# Patient Record
Sex: Female | Born: 1945 | Race: Black or African American | Hispanic: No
Health system: Southern US, Community
[De-identification: ages and names within clinical notes are randomized; demographics above are authoritative.]

---

## 2019-07-17 ENCOUNTER — Other Ambulatory Visit: Payer: Self-pay

## 2019-07-17 ENCOUNTER — Emergency Department (HOSPITAL_COMMUNITY): Payer: Medicare Other

## 2019-07-17 ENCOUNTER — Emergency Department (HOSPITAL_COMMUNITY)
Admission: EM | Admit: 2019-07-17 | Discharge: 2019-07-18 | Disposition: A | Discharge status: Home / Self Care | Payer: Medicare Other | Attending: Emergency Medicine | Admitting: Emergency Medicine

## 2019-07-17 ENCOUNTER — Encounter (HOSPITAL_COMMUNITY): Payer: Self-pay | Admitting: Emergency Medicine

## 2019-07-17 DIAGNOSIS — R531 Weakness: Secondary | ICD-10-CM | POA: Diagnosis not present

## 2019-07-17 DIAGNOSIS — R05 Cough: Secondary | ICD-10-CM | POA: Diagnosis not present

## 2019-07-17 DIAGNOSIS — Z794 Long term (current) use of insulin: Secondary | ICD-10-CM | POA: Insufficient documentation

## 2019-07-17 DIAGNOSIS — E11649 Type 2 diabetes mellitus with hypoglycemia without coma: Secondary | ICD-10-CM | POA: Diagnosis not present

## 2019-07-17 DIAGNOSIS — U071 COVID-19: Secondary | ICD-10-CM | POA: Insufficient documentation

## 2019-07-17 DIAGNOSIS — E162 Hypoglycemia, unspecified: Secondary | ICD-10-CM

## 2019-07-17 LAB — CBG MONITORING, ED: Glucose-Capillary: 99 mg/dL (ref 70–99)

## 2019-07-17 NOTE — ED Triage Notes (Addendum)
Pt arrived via [facility] from [facility]. Per Facility pt was having respiratory issues with o2 sats in 80s. On arrival CBG 38, 1 mg glucogon administered by facility. Follow up CBG 81. Pt maintained 94 SPo2 enroute on room air and c/o dry cough. Pt found in covid wing and is on week 2 of Covid diagnosis

## 2019-07-17 NOTE — ED Provider Notes (Signed)
[facility] EMERGENCY DEPARTMENT Provider Note   CSN: [id number] Arrival date & time: 07/17/19  2210     History   Chief Complaint Chief Complaint  Patient presents with  . covid +  . Hypoglycemia    HPI Vallory Oetken is a 73 y.o. female.     The history is provided by the patient and the nursing home.  Hypoglycemia Severity:  Severe Onset quality:  Sudden Timing:  Constant Progression:  Improving Chronicity:  New Diabetic status:  Controlled with insulin Relieved by:  Glucagon Associated symptoms: weakness   Associated symptoms: no shortness of breath and no vomiting   Patient presents from nursing facility.  Per facility, patient was having respiratory issues and coughing.  She was also noted to be more lethargic and weak glucose was in the 30s.  Glucagon was given and patient improved.  Patient is currently residing on the Covid unit at the facility.  She has been there for about 4 days  Currently patient reports cough but denies any other complaints.  She denies any chest pain or shortness of breath.  Per nursing facility, last dose of insulin was 11:30 AM   PMH-diabetes OB History   No obstetric history on file.      Home Medications    Prior to Admission medications   Not on File    Family History No family history on file.  Social History Social History   Tobacco Use  . Smoking status: Not on file  Substance Use Topics  . Alcohol use: Not on file  . Drug use: Not on file     Allergies   Patient has no allergy information on record.   Review of Systems Review of Systems  Respiratory: Positive for cough. Negative for shortness of breath.   Cardiovascular: Negative for chest pain.  Gastrointestinal: Negative for vomiting.  Neurological: Positive for weakness.  All other systems reviewed and are negative.    Physical Exam Updated Vital Signs BP 104/65   Pulse 63   Temp 98.4 F (36.9 C) (Oral)   Resp 18    SpO2 98%   Physical Exam CONSTITUTIONAL: Elderly, no acute distress HEAD: Normocephalic/atraumatic EYES: EOMI/PERRL ENMT: Mucous membranes moist NECK: supple no meningeal signs SPINE/BACK:entire spine nontender CV: S1/S2 noted, no murmurs/rubs/gallops noted LUNGS: Coarse breath sounds in the bases, no apparent distress ABDOMEN: soft, nontender, no rebound or guarding, bowel sounds noted throughout abdomen GU:no cva tenderness NEURO: Pt is awake/alert/appropriate, moves all extremitiesx4.  No facial droop.   EXTREMITIES: pulses normal/equal, full ROM SKIN: warm, color normal PSYCH: no abnormalities of mood noted, alert and oriented to situation   ED Treatments / Results  Labs (all labs ordered are listed, but only abnormal results are displayed) Labs Reviewed  BASIC METABOLIC PANEL - Abnormal; Notable for the following components:      Result Value   Glucose, Bld 105 (*)    Calcium 8.7 (*)    All other components within normal limits  CBC WITH DIFFERENTIAL/PLATELET - Abnormal; Notable for the following components:   WBC 16.7 (*)    Hemoglobin 10.8 (*)    HCT 35.0 (*)    MCH 25.2 (*)    Neutro Abs 12.8 (*)    Monocytes Absolute 1.4 (*)    Abs Immature Granulocytes 0.23 (*)    All other components within normal limits  BRAIN NATRIURETIC PEPTIDE - Abnormal; Notable for the following components:   B Natriuretic Peptide 132.0 (*)    All  other components within normal limits  CBG MONITORING, ED - Abnormal; Notable for the following components:   Glucose-Capillary 61 (*)    All other components within normal limits  CBG MONITORING, ED - Abnormal; Notable for the following components:   Glucose-Capillary 124 (*)    All other components within normal limits  CBG MONITORING, ED  CBG MONITORING, ED  CBG MONITORING, ED  CBG MONITORING, ED    EKG EKG Interpretation  Date/Time:  Friday July 18 2019 00:25:10 EST Ventricular Rate:  65 PR Interval:    QRS Duration: 90  QT Interval:  426 QTC Calculation: 443 R Axis:   -25 Text Interpretation: Sinus rhythm RSR' in V1 or V2, probably normal variant Inferior infarct, age indeterminate Lateral leads are also involved Interpretation limited secondary to artifact No previous ECGs available Confirmed by Zadie Rhine ([id number]) on 07/18/2019 12:38:57 AM   Radiology Dg Chest Port 1 View  Result Date: 07/17/2019 CLINICAL DATA:  Cough, low O2 sats EXAM: PORTABLE CHEST 1 VIEW COMPARISON:  None. FINDINGS: There are some coarse interstitial opacities and patchy areas of airspace disease in the lower lungs bilaterally. Lung volumes are diminished with atelectatic change potentially accentuating the appearance. The aorta is calcified. The remaining cardiomediastinal contours are unremarkable. Degenerative changes are present in the imaged spine and shoulders. Vascular calcium noted in the base of the right neck. No acute osseous or soft tissue abnormality. IMPRESSION: 1. Interstitial and airspace opacities in the bases may reflect developing edema versus atypical infection. 2. Low lung volumes with atelectatic change. 3. Aortic atherosclerosis. Electronically Signed   By: Kreg Shropshire M.D.   On: 07/17/2019 23:45    Procedures Procedures   Medications Ordered in ED Medications - No data to display   Initial Impression / Assessment and Plan / ED Course  I have reviewed the triage vital signs and the nursing notes.  Pertinent labs & imaging results that were available during my care of the patient were reviewed by me and considered in my medical decision making (see chart for details).        1:31 AM Patient presents from nursing home for concern for respiratory issues as well as hypoglycemia.  Patient is in no acute distress.  No hypoxia on room air.  Her glucoses continue to improve.  She is awake alert this time.  We will continue to monitor 3:39 AM Patient's glucose is improving.  She is awake and alert.  There  is no hypoxia. Infiltrates likely due to her Covid diagnosis.  At this point she does not require oxygen, therefore do not feel admission is warranted.  She will need to have her insulin and diabetic medications held until reviewed by her physician at the facility   Hardie Pulley was evaluated in Emergency Department on 07/17/2019 for the symptoms described in the history of present illness. She was evaluated in the context of the global COVID-19 pandemic, which necessitated consideration that the patient might be at risk for infection with the SARS-CoV-2 virus that causes COVID-19. Institutional protocols and algorithms that pertain to the evaluation of patients at risk for COVID-19 are in a state of rapid change based on information released by regulatory bodies including the CDC and federal and state organizations. These policies and algorithms were followed during the patient's care in the ED.  Final Clinical Impressions(s) / ED Diagnoses   Final diagnoses:  Hypoglycemia  COVID-19    ED Discharge Orders    None  Zadie RhineWickline, Kaisei Gilbo, MD 07/18/19 [phone]

## 2019-07-18 DIAGNOSIS — E11649 Type 2 diabetes mellitus with hypoglycemia without coma: Secondary | ICD-10-CM | POA: Diagnosis not present

## 2019-07-18 LAB — BASIC METABOLIC PANEL
Anion gap: 10 (ref 5–15)
BUN: 16 mg/dL (ref 8–23)
CO2: 25 mmol/L (ref 22–32)
Calcium: 8.7 mg/dL — ABNORMAL LOW (ref 8.9–10.3)
Chloride: 103 mmol/L (ref 98–111)
Creatinine, Ser: 0.91 mg/dL (ref 0.44–1.00)
GFR calc Af Amer: >60 mL/min (ref >60)
GFR calc non Af Amer: >60 mL/min (ref >60)
Glucose, Bld: 105 mg/dL — ABNORMAL HIGH (ref 70–99)
Potassium: 4.1 mmol/L (ref 3.5–5.1)
Sodium: 138 mmol/L (ref 135–145)

## 2019-07-18 LAB — CBC WITH DIFFERENTIAL/PLATELET
Abs Immature Granulocytes: 0.23 10*3/uL — ABNORMAL HIGH (ref 0.00–0.07)
Basophils Absolute: 0.1 10*3/uL (ref 0.0–0.1)
Basophils Relative: 0 %
Eosinophils Absolute: 0 10*3/uL (ref 0.0–0.5)
Eosinophils Relative: 0 %
HCT: 35 % — ABNORMAL LOW (ref 36.0–46.0)
Hemoglobin: 10.8 g/dL — ABNORMAL LOW (ref 12.0–15.0)
Immature Granulocytes: 1 %
Lymphocytes Relative: 13 %
Lymphs Abs: 2.2 10*3/uL (ref 0.7–4.0)
MCH: 25.2 pg — ABNORMAL LOW (ref 26.0–34.0)
MCHC: 30.9 g/dL (ref 30.0–36.0)
MCV: 81.8 fL (ref 80.0–100.0)
Monocytes Absolute: 1.4 10*3/uL — ABNORMAL HIGH (ref 0.1–1.0)
Monocytes Relative: 9 %
Neutro Abs: 12.8 10*3/uL — ABNORMAL HIGH (ref 1.7–7.7)
Neutrophils Relative %: 77 %
Platelets: 339 10*3/uL (ref 150–400)
RBC: 4.28 MIL/uL (ref 3.87–5.11)
RDW: 15 % (ref 11.5–15.5)
WBC: 16.7 10*3/uL — ABNORMAL HIGH (ref 4.0–10.5)
nRBC: 0 % (ref 0.0–0.2)

## 2019-07-18 LAB — BRAIN NATRIURETIC PEPTIDE: B Natriuretic Peptide: 132 pg/mL — ABNORMAL HIGH (ref 0.0–100.0)

## 2019-07-18 LAB — CBG MONITORING, ED
Glucose-Capillary: 124 mg/dL — ABNORMAL HIGH (ref 70–99)
Glucose-Capillary: 61 mg/dL — ABNORMAL LOW (ref 70–99)
Glucose-Capillary: 87 mg/dL (ref 70–99)

## 2019-07-18 NOTE — ED Notes (Signed)
Report given to [place] at [facility]. Follow up care and d/c instructions reviewed. LPN verbalized understanding.

## 2019-07-18 NOTE — Discharge Instructions (Addendum)
Please hold all of her insulin and diabetes medications for the next 24 hours.  Please have her doctor review all of her medications prior to restarting insulin

## 2019-07-18 NOTE — ED Notes (Signed)
[facility] CONTACTED FOR TRANSPORT

## 2019-07-18 NOTE — ED Notes (Signed)
Called [facility] for transport to [facility]  

## 2020-11-22 IMAGING — DX DG CHEST 1V PORT
1 series · 1 of 1 positions shown · non-contrast
Comparison: None.

CLINICAL DATA: Cough, low O2 sats

EXAM:
PORTABLE CHEST 1 VIEW

[chest ap]
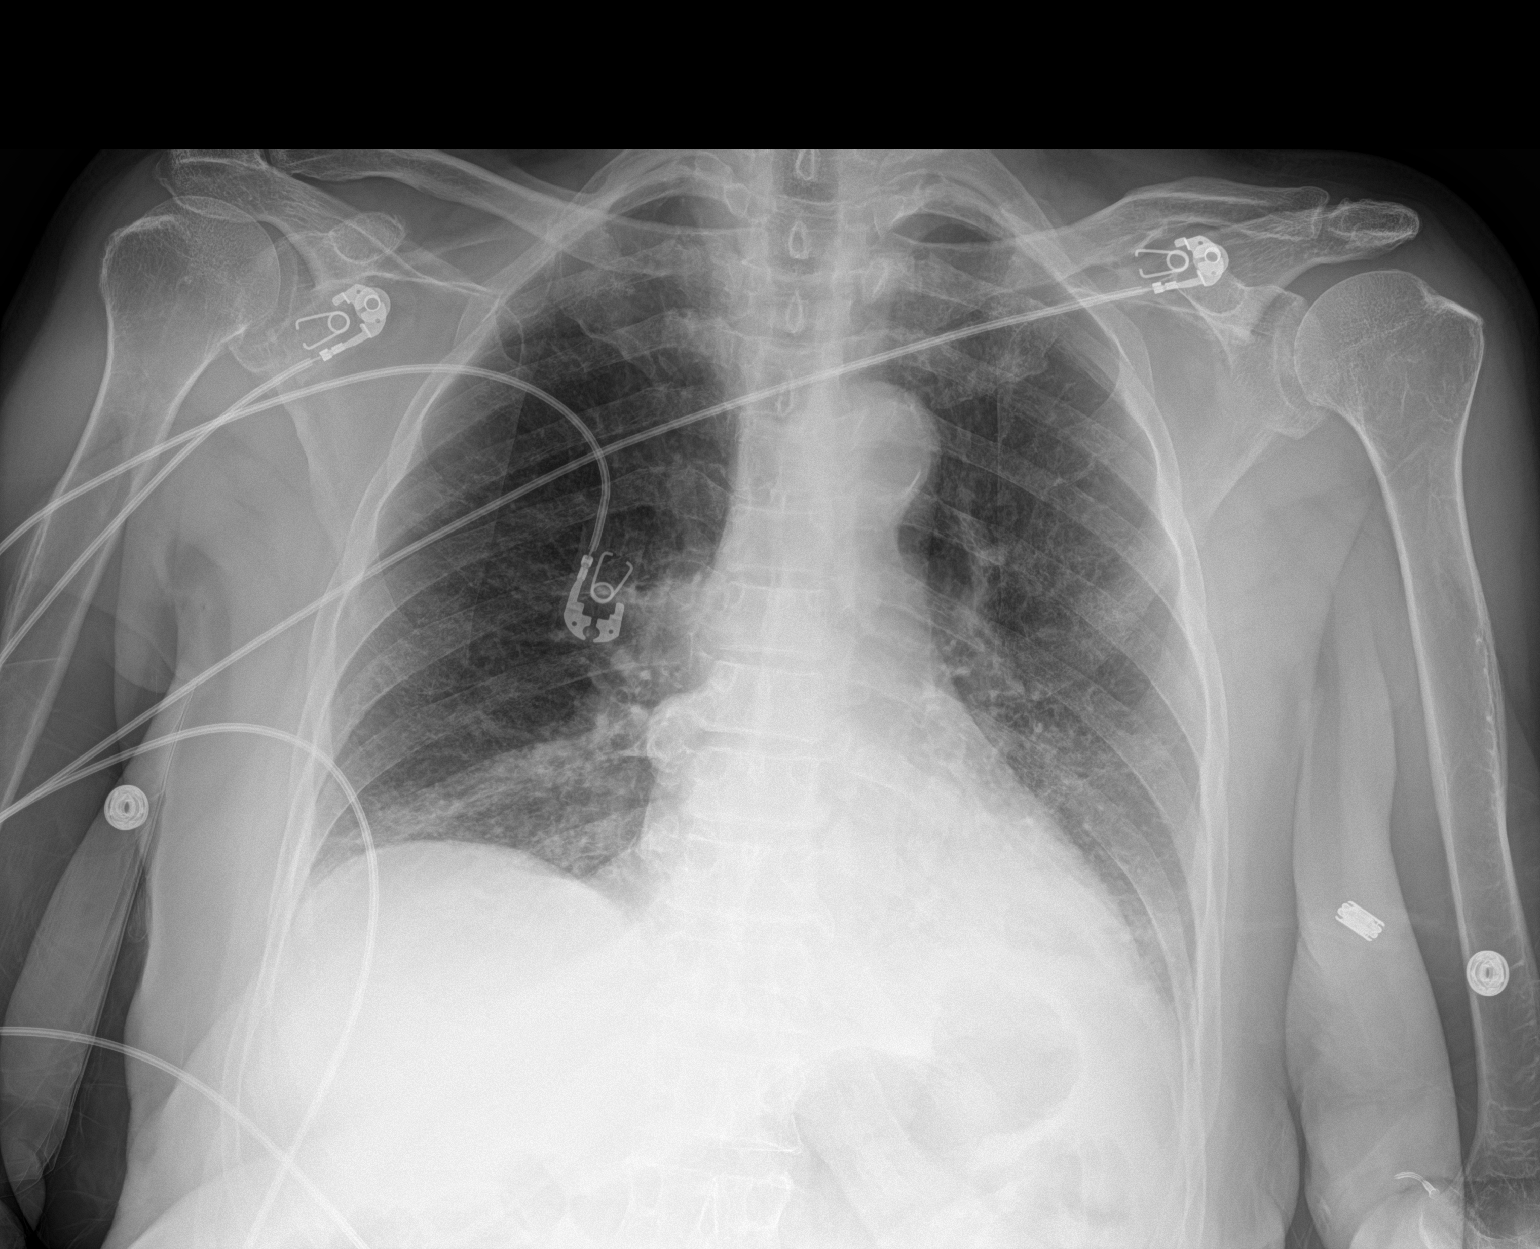

[1 of 1 positions shown; findings below may reference images not displayed]

FINDINGS: There are some coarse interstitial opacities and patchy areas of
airspace disease in the lower lungs bilaterally. Lung volumes are
diminished with atelectatic change potentially accentuating the
appearance. The aorta is calcified. The remaining cardiomediastinal
contours are unremarkable. Degenerative changes are present in the
imaged spine and shoulders. Vascular calcium noted in the base of
the right neck. No acute osseous or soft tissue abnormality.
IMPRESSION: 1. Interstitial and airspace opacities in the bases may reflect
developing edema versus atypical infection.
2. Low lung volumes with atelectatic change.
3. Aortic atherosclerosis.
